# Patient Record
Sex: Male | Born: 1990 | Race: Black or African American | Hispanic: No | Marital: Single | Smoking: Former smoker
Health system: Southern US, Community
[De-identification: ages and names within clinical notes are randomized; demographics above are authoritative.]

## PROBLEM LIST (undated history)

## (undated) DIAGNOSIS — A599 Trichomoniasis, unspecified: Secondary | ICD-10-CM

---

## 2012-04-22 ENCOUNTER — Encounter (HOSPITAL_COMMUNITY): Payer: Self-pay

## 2012-04-22 ENCOUNTER — Emergency Department (HOSPITAL_COMMUNITY)
Admission: EM | Admit: 2012-04-22 | Discharge: 2012-04-22 | Disposition: A | Discharge status: Home / Self Care | Payer: No Typology Code available for payment source | Attending: Emergency Medicine | Admitting: Emergency Medicine

## 2012-04-22 ENCOUNTER — Emergency Department (HOSPITAL_COMMUNITY): Payer: No Typology Code available for payment source

## 2012-04-22 DIAGNOSIS — F17200 Nicotine dependence, unspecified, uncomplicated: Secondary | ICD-10-CM | POA: Insufficient documentation

## 2012-04-22 DIAGNOSIS — S39012A Strain of muscle, fascia and tendon of lower back, initial encounter: Secondary | ICD-10-CM

## 2012-04-22 DIAGNOSIS — M549 Dorsalgia, unspecified: Secondary | ICD-10-CM | POA: Insufficient documentation

## 2012-04-22 DIAGNOSIS — M542 Cervicalgia: Secondary | ICD-10-CM | POA: Insufficient documentation

## 2012-04-22 DIAGNOSIS — Y92410 Unspecified street and highway as the place of occurrence of the external cause: Secondary | ICD-10-CM | POA: Insufficient documentation

## 2012-04-22 MED ORDER — IBUPROFEN 800 MG PO TABS
800.0000 mg | ORAL_TABLET | Freq: Once | ORAL | Status: AC
  Administered 2012-04-22: 800 mg via ORAL
  Filled 2012-04-22: qty 1
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
## 2012-05-13 ENCOUNTER — Emergency Department (HOSPITAL_COMMUNITY)
Admission: EM | Admit: 2012-05-13 | Discharge: 2012-05-13 | Disposition: A | Discharge status: Home / Self Care | Payer: Self-pay | Attending: Emergency Medicine | Admitting: Emergency Medicine

## 2012-05-13 ENCOUNTER — Encounter (HOSPITAL_COMMUNITY): Payer: Self-pay

## 2012-05-13 DIAGNOSIS — Z202 Contact with and (suspected) exposure to infections with a predominantly sexual mode of transmission: Secondary | ICD-10-CM | POA: Insufficient documentation

## 2012-05-13 DIAGNOSIS — F17200 Nicotine dependence, unspecified, uncomplicated: Secondary | ICD-10-CM | POA: Insufficient documentation

## 2012-05-13 MED ORDER — CEFTRIAXONE SODIUM 1 G IJ SOLR
1.0000 g | Freq: Once | INTRAMUSCULAR | Status: AC
  Administered 2012-05-13: 1 g via INTRAMUSCULAR
  Filled 2012-05-13: qty 10

## 2012-05-13 MED ORDER — LIDOCAINE HCL 1 % IJ SOLN
INTRAMUSCULAR | Status: AC
  Administered 2012-05-13: 20 mL
  Filled 2012-05-13: qty 20

## 2012-05-13 MED ORDER — AZITHROMYCIN 250 MG PO TABS
1000.0000 mg | ORAL_TABLET | Freq: Once | ORAL | Status: AC
  Administered 2012-05-13: 1000 mg via ORAL
  Filled 2012-05-13: qty 4

## 2012-05-13 NOTE — ED Notes (Signed)
Per pt, girlfriend dx with chlamydia 2 days ago.  Pt has no s/sx.  Told to come in to hospital to be treated.

## 2012-05-13 NOTE — ED Provider Notes (Signed)
History   This chart was scribed for non-physician practitioner working with Lyanne Co, MD by Greer Ee. This patient was seen in room WTR1/WLPT1 and the patient's care was started at 20:47.    CSN: [id number]  Arrival date & time 05/13/12  2033   None     Chief Complaint  Patient presents with  . SEXUALLY TRANSMITTED DISEASE    (Consider location/radiation/quality/duration/timing/severity/associated sxs/prior treatment) The history is provided by the patient. No language interpreter was used.   Joshua Odonnell is a 21 y.o. male who presents to the Emergency Department after being told by his girlfriend that she has chlamydia.  Pt reports unprotected sex with girlfriend in past week.  Per pt, girlfriend had intercourse with another partner 2 months ago but is unaware of any other sexual partners.  Pt deenies penile discharge or pain, testicular pain or swelling, and any unusual rashes.  Pt has no h/o chronic medical conditions.  Pt is a current everyday smoker but denies alcohol use.  No past medical history on file.  No past surgical history on file.  No family history on file.  History  Substance Use Topics  . Smoking status: Current Every Day Smoker  . Smokeless tobacco: Not on file  . Alcohol Use: No      Review of Systems  Genitourinary: Negative for discharge, genital sores, penile pain and testicular pain.  All other systems reviewed and are negative.    Allergies  Review of patient's allergies indicates no known allergies.  Home Medications   Current Outpatient Rx  Name Route Sig Dispense Refill  . IBUPROFEN 800 MG PO TABS Oral Take 800 mg by mouth once. Pain      BP 131/79  Pulse 85  Temp 98.4 F (36.9 C) (Oral)  Resp 18  SpO2 100%  Physical Exam  Nursing note and vitals reviewed. Constitutional: He is oriented to person, place, and time. He appears well-developed and well-nourished.  HENT:  Head: Normocephalic and atraumatic.  Eyes: EOM  are normal.  Neck: Normal range of motion. No tracheal deviation present.  Cardiovascular: Normal rate, regular rhythm and normal heart sounds.   No murmur heard. Pulmonary/Chest: Effort normal. He has no wheezes.  Abdominal: Soft. There is no tenderness.  Genitourinary: Testes normal and penis normal. Right testis shows no swelling and no tenderness. Left testis shows no swelling and no tenderness. Circumcised. No penile tenderness. No discharge found.  Musculoskeletal: Normal range of motion.  Neurological: He is alert and oriented to person, place, and time.  Skin: Skin is warm.  Psychiatric: He has a normal mood and affect.    ED Course  Procedures (including critical care time) DIAGNOSTIC STUDIES: Oxygen Saturation is 100% on room air, normal by my interpretation.    COORDINATION OF CARE: 20:53- Patient informed of clinical course, understand medical decision-making process, and agree with plan.  Discussed proactively treating pt for gonorrhea and chlamydia and pt agreed.      Labs Reviewed - No data to display No results found.   1. Exposure to chlamydia   2. Exposure to STD       MDM  Patient's partner recently diagnosed with Chlamydia. He is concerned for her obtaining STD. Currently having no symptoms and feeling fine. Cultures obtained. Will treat for both gonorrhea and Chlamydia.  I personally performed the services described in this documentation, which was scribed in my presence. The recorded information has been reviewed and considered. Lyanne Co, MD   Melina Schools  Judeth Cornfield, PA-C 05/13/12 2105  Trevor Mace, PA-C 05/13/12 2106

## 2012-05-14 LAB — GC/CHLAMYDIA PROBE AMP, GENITAL: GC Probe Amp, Genital: NEGATIVE

## 2012-05-14 NOTE — ED Provider Notes (Signed)
Medical screening examination/treatment/procedure(s) were performed by non-physician practitioner and as supervising physician I was immediately available for consultation/collaboration.   Lyanne Co, MD 05/14/12 [phone]

## 2012-05-15 NOTE — ED Notes (Signed)
+   Chlamydia Patient treated with rocephin and zithromax-[facility] letter faxed. 

## 2012-05-16 NOTE — ED Notes (Signed)
Patient informed of positive results after id'd x 2 and informed of need to notify partner to be treated. 

## 2012-08-17 ENCOUNTER — Encounter (HOSPITAL_COMMUNITY): Payer: Self-pay | Admitting: Emergency Medicine

## 2012-08-17 ENCOUNTER — Emergency Department (HOSPITAL_COMMUNITY)
Admission: EM | Admit: 2012-08-17 | Discharge: 2012-08-17 | Disposition: A | Discharge status: Home / Self Care | Payer: Self-pay | Attending: Emergency Medicine | Admitting: Emergency Medicine

## 2012-08-17 DIAGNOSIS — Z79899 Other long term (current) drug therapy: Secondary | ICD-10-CM | POA: Insufficient documentation

## 2012-08-17 DIAGNOSIS — R21 Rash and other nonspecific skin eruption: Secondary | ICD-10-CM | POA: Insufficient documentation

## 2012-08-17 DIAGNOSIS — F17200 Nicotine dependence, unspecified, uncomplicated: Secondary | ICD-10-CM | POA: Insufficient documentation

## 2012-08-17 MED ORDER — FAMOTIDINE 20 MG PO TABS: 20.0000 mg | ORAL_TABLET | Freq: Two times a day (BID) | ORAL | Status: DC

## 2012-08-17 MED ORDER — DIPHENHYDRAMINE HCL 25 MG PO CAPS
25.0000 mg | ORAL_CAPSULE | Freq: Once | ORAL | Status: DC
  Filled 2012-08-17: qty 1

## 2012-08-17 MED ORDER — PREDNISONE 20 MG PO TABS: 40.0000 mg | ORAL_TABLET | Freq: Every day | ORAL | Status: DC

## 2012-08-17 MED ORDER — DIPHENHYDRAMINE HCL 25 MG PO TABS: 25.0000 mg | ORAL_TABLET | Freq: Four times a day (QID) | ORAL | Status: DC | PRN

## 2012-08-17 NOTE — ED Notes (Signed)
MD at bedside. 

## 2012-08-17 NOTE — ED Notes (Signed)
Pt reports bringing dog inside the house due to snow 2 days prior when small reddened painless rash started on chest, arms, back, and most recently legs. Pt reports it itches and has used blue star ointment on it with no relief.

## 2012-08-17 NOTE — ED Notes (Signed)
Pt alert, arrives from home, c/o rash to to arms, face, neck, forehead, onset was several days ago, believed to be associated with dog, resp even unlabored, skin pwd

## 2012-08-17 NOTE — ED Provider Notes (Signed)
History     CSN: [id number]  Arrival date & time 08/17/12  0304   First MD Initiated Contact with Patient 08/17/12 [phone]      Chief Complaint  Patient presents with  . Rash    (Consider location/radiation/quality/duration/timing/severity/associated sxs/prior treatment) HPI Comments: 22 year old male who presents approximately 24 hours of rash to his bilateral upper sternal is, trunk, lower extremities and face. He denies any lesions in his eyes or his mouth. He denies any new exposures including foods, topical, inhalational or medications. The symptoms are mild, persistent causing itching but no difficulty breathing.  Patient is a 22 y.o. male presenting with rash. The history is provided by the patient.  Rash     History reviewed. No pertinent past medical history.  History reviewed. No pertinent past surgical history.  No family history on file.  History  Substance Use Topics  . Smoking status: Current Every Day Smoker  . Smokeless tobacco: Not on file  . Alcohol Use: No      Review of Systems  Constitutional: Negative for fever, chills and fatigue.  Respiratory: Negative for shortness of breath.   Gastrointestinal: Negative for nausea and vomiting.  Musculoskeletal: Negative for joint swelling.  Skin: Positive for rash.  Neurological: Negative for weakness and headaches.  Hematological: Negative for adenopathy.    Allergies  Review of patient's allergies indicates no known allergies.  Home Medications   Current Outpatient Rx  Name  Route  Sig  Dispense  Refill  . DIPHENHYDRAMINE HCL 25 MG PO TABS   Oral   Take 1 tablet (25 mg total) by mouth every 6 (six) hours as needed for itching (Rash).   30 tablet   0   . FAMOTIDINE 20 MG PO TABS   Oral   Take 1 tablet (20 mg total) by mouth 2 (two) times daily.   30 tablet   0   . IBUPROFEN 800 MG PO TABS   Oral   Take 800 mg by mouth once. Pain         . PREDNISONE 20 MG PO TABS   Oral   Take 2 tablets  (40 mg total) by mouth daily.   10 tablet   0     BP 133/77  Pulse 79  Temp 97.4 F (36.3 C) (Oral)  Resp 16  Wt 165 lb (74.844 kg)  SpO2 100%  Physical Exam  Nursing note and vitals reviewed. Constitutional: He appears well-developed and well-nourished. No distress.  HENT:  Head: Normocephalic and atraumatic.  Eyes: Conjunctivae normal are normal. No scleral icterus.  Cardiovascular: Normal rate, regular rhythm and intact distal pulses.   Pulmonary/Chest: Effort normal and breath sounds normal.  Musculoskeletal: He exhibits no edema and no tenderness.  Neurological: He is alert.  Skin: Skin is warm and dry. Rash noted. He is not diaphoretic.       Erythematous rash, no definite urticarial rash, no petechiae or purpura.  Scattered over the body / trunk / extremities    ED Course  Procedures (including critical care time)  Labs Reviewed - No data to display No results found.   1. Rash       MDM  This is well-appearing, benign appearing rash, stable for discharge on prednisone Benadryl.        Vida Roller, MD 08/17/12 [phone]

## 2012-08-17 NOTE — ED Notes (Signed)
Pt was upset because he didn't want to drive to get his prescriptions filled. Informed pt that I did not feel comfortable driving home with Benadryl in his system due to side effects of making him drowsy. Pt requested that I scan it, give him the medication, and he ingest the medication when he gets home. Informed pt that I could not legally do that. Pt AAOx4, ambulatory, and in no acute distress. Informed pt about pharmacies open 24 hours. Informed pt that the Benadryl I was about to give him is available otc in any open stores, specifically [employer] (and directed him to the right address).

## 2012-10-06 ENCOUNTER — Encounter (HOSPITAL_COMMUNITY): Payer: Self-pay | Admitting: Emergency Medicine

## 2012-10-06 ENCOUNTER — Emergency Department (HOSPITAL_COMMUNITY)
Admission: EM | Admit: 2012-10-06 | Discharge: 2012-10-06 | Disposition: A | Discharge status: Home / Self Care | Payer: No Typology Code available for payment source | Attending: Emergency Medicine | Admitting: Emergency Medicine

## 2012-10-06 DIAGNOSIS — S0993XA Unspecified injury of face, initial encounter: Secondary | ICD-10-CM | POA: Insufficient documentation

## 2012-10-06 DIAGNOSIS — S0990XA Unspecified injury of head, initial encounter: Secondary | ICD-10-CM | POA: Insufficient documentation

## 2012-10-06 DIAGNOSIS — S199XXA Unspecified injury of neck, initial encounter: Secondary | ICD-10-CM | POA: Insufficient documentation

## 2012-10-06 DIAGNOSIS — M7918 Myalgia, other site: Secondary | ICD-10-CM

## 2012-10-06 DIAGNOSIS — IMO0002 Reserved for concepts with insufficient information to code with codable children: Secondary | ICD-10-CM | POA: Insufficient documentation

## 2012-10-06 DIAGNOSIS — Y92410 Unspecified street and highway as the place of occurrence of the external cause: Secondary | ICD-10-CM | POA: Insufficient documentation

## 2012-10-06 DIAGNOSIS — F17200 Nicotine dependence, unspecified, uncomplicated: Secondary | ICD-10-CM | POA: Insufficient documentation

## 2012-10-06 DIAGNOSIS — Y939 Activity, unspecified: Secondary | ICD-10-CM | POA: Insufficient documentation

## 2012-10-06 MED ORDER — DIAZEPAM 5 MG PO TABS: 5.0000 mg | ORAL_TABLET | Freq: Four times a day (QID) | ORAL | Status: DC | PRN

## 2012-10-06 MED ORDER — OXYCODONE-ACETAMINOPHEN 5-325 MG PO TABS: ORAL_TABLET | ORAL | Status: DC

## 2012-10-06 MED ORDER — IBUPROFEN 800 MG PO TABS: 800.0000 mg | ORAL_TABLET | Freq: Once | ORAL | Status: DC
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
## 2012-10-07 NOTE — ED Provider Notes (Signed)
Medical screening examination/treatment/procedure(s) were performed by non-physician practitioner and as supervising physician I was immediately available for consultation/collaboration.   Eben Choinski R Shareef Eddinger, MD 10/07/12 1627 

## 2012-11-30 ENCOUNTER — Emergency Department (HOSPITAL_COMMUNITY)
Admission: EM | Admit: 2012-11-30 | Discharge: 2012-11-30 | Disposition: A | Discharge status: Home / Self Care | Payer: Self-pay | Attending: Emergency Medicine | Admitting: Emergency Medicine

## 2012-11-30 ENCOUNTER — Encounter (HOSPITAL_COMMUNITY): Payer: Self-pay

## 2012-11-30 DIAGNOSIS — F17200 Nicotine dependence, unspecified, uncomplicated: Secondary | ICD-10-CM | POA: Insufficient documentation

## 2012-11-30 DIAGNOSIS — M545 Low back pain, unspecified: Secondary | ICD-10-CM | POA: Insufficient documentation

## 2012-11-30 MED ORDER — IBUPROFEN 800 MG PO TABS: 800.0000 mg | ORAL_TABLET | Freq: Three times a day (TID) | ORAL | Status: DC

## 2012-11-30 MED ORDER — CYCLOBENZAPRINE HCL 10 MG PO TABS: 10.0000 mg | ORAL_TABLET | Freq: Two times a day (BID) | ORAL | Status: DC | PRN

## 2012-11-30 NOTE — ED Notes (Signed)
Pt presents with NAD.  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  Pt completed shift last night and attempted to work this evening. Left work this evening with c/o of back injury.  Contact supervisor John.  Aware pt is here. Contact Number [phone]. Pt states he is not required to take drug test.  [employer] is the name of pt's employer. Pt present neuro intact PERRL no loss of bowel or bladder and denies numbness or tingling

## 2012-11-30 NOTE — ED Provider Notes (Signed)
History    This chart was scribed for non-physician practitioner Elpidio Anis PA-C working with Doug Sou, MD by Smitty Pluck, ED scribe. This patient was seen in room WTR5/WTR5 and the patient's care was started at 10:15 PM.   CSN: [id number]  Arrival date & time 11/30/12  2000     No chief complaint on file.    The history is provided by the patient and medical records. No language interpreter was used.   HPI Comments: Joshua Odonnell is a 22 y.o. male who presents to the Emergency Department complaining of lower back pain onset 1 day ago worsening today. Pt reports that he was lifting heavy objects at work yesterday and noticed the pain after lifting. Pt reports that he has taken tylenol and advil without relief. Pain is aggravated by movement and bending. Pt denies urinary incontinence and bowel incontinence,  fever, chills, nausea, vomiting, diarrhea, weakness, cough, SOB and any other pain.     No past medical history on file.  No past surgical history on file.  No family history on file.  History  Substance Use Topics  . Smoking status: Current Every Day Smoker  . Smokeless tobacco: Not on file  . Alcohol Use: No      Review of Systems  Constitutional: Negative for fever and chills.  Respiratory: Negative for shortness of breath.   Gastrointestinal: Negative for nausea and vomiting.  Musculoskeletal: Positive for back pain.  Neurological: Negative for weakness.    Allergies  Review of patient's allergies indicates no known allergies.  Home Medications  No current outpatient prescriptions on file.  BP 123/60  Pulse 82  Temp(Src) 98.3 F (36.8 C) (Oral)  Resp 18  Ht 6' (1.829 m)  Wt 155 lb (70.308 kg)  BMI 21.02 kg/m2  SpO2 100%  Physical Exam  Nursing note and vitals reviewed. Constitutional: He is oriented to person, place, and time. He appears well-developed and well-nourished. No distress.  HENT:  Head: Normocephalic and atraumatic.  Eyes:  EOM are normal.  Neck: Neck supple. No tracheal deviation present.  Cardiovascular: Normal rate, regular rhythm and normal heart sounds.   Pulmonary/Chest: Effort normal. No respiratory distress.  Musculoskeletal: Normal range of motion.  Right paralumbar tenderness without spasm No swelling visualized   Neurological: He is alert and oriented to person, place, and time. He has normal reflexes.  Skin: Skin is warm and dry.  Psychiatric: He has a normal mood and affect. His behavior is normal.    ED Course  Procedures (including critical care time) DIAGNOSTIC STUDIES: Oxygen Saturation is 100% on room air, normal by my interpretation.    COORDINATION OF CARE: 10:17 PM Discussed ED treatment with pt and pt agrees.     Labs Reviewed - No data to display No results found.   No diagnosis found.  1. Low back pain  MDM  Uncomplicated muscular low back pain.      I personally performed the services described in this documentation, which was scribed in my presence. The recorded information has been reviewed and is accurate.     Arnoldo Hooker, PA-C 12/01/12 0100

## 2012-12-02 NOTE — ED Provider Notes (Signed)
Medical screening examination/treatment/procedure(s) were performed by non-physician practitioner and as supervising physician I was immediately available for consultation/collaboration.  Berneice Zettlemoyer, MD 12/02/12 1501 

## 2013-08-31 ENCOUNTER — Encounter (HOSPITAL_COMMUNITY): Payer: Self-pay | Admitting: Emergency Medicine

## 2013-08-31 ENCOUNTER — Emergency Department (HOSPITAL_COMMUNITY)
Admission: EM | Admit: 2013-08-31 | Discharge: 2013-08-31 | Discharge status: Against Medical Advice | Payer: No Typology Code available for payment source | Attending: Emergency Medicine | Admitting: Emergency Medicine

## 2013-08-31 DIAGNOSIS — R109 Unspecified abdominal pain: Secondary | ICD-10-CM | POA: Insufficient documentation

## 2013-08-31 DIAGNOSIS — F17200 Nicotine dependence, unspecified, uncomplicated: Secondary | ICD-10-CM | POA: Insufficient documentation
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
## 2013-08-31 NOTE — ED Notes (Signed)
Per pt, states having abdominal pain for 2 days-happens with BMs-states some diarrhea-no N/V

## 2014-05-18 ENCOUNTER — Emergency Department (HOSPITAL_COMMUNITY)
Admission: EM | Admit: 2014-05-18 | Discharge: 2014-05-18 | Disposition: A | Discharge status: Home / Self Care | Payer: No Typology Code available for payment source | Attending: Emergency Medicine | Admitting: Emergency Medicine

## 2014-05-18 ENCOUNTER — Encounter (HOSPITAL_COMMUNITY): Payer: Self-pay | Admitting: Emergency Medicine

## 2014-05-18 DIAGNOSIS — A599 Trichomoniasis, unspecified: Secondary | ICD-10-CM

## 2014-05-18 DIAGNOSIS — Z720 Tobacco use: Secondary | ICD-10-CM | POA: Insufficient documentation

## 2014-05-18 DIAGNOSIS — Z791 Long term (current) use of non-steroidal anti-inflammatories (NSAID): Secondary | ICD-10-CM | POA: Insufficient documentation

## 2014-05-18 DIAGNOSIS — Z760 Encounter for issue of repeat prescription: Secondary | ICD-10-CM

## 2014-05-18 HISTORY — DX: Trichomoniasis, unspecified: A59.9

## 2014-05-18 MED ORDER — METRONIDAZOLE 500 MG PO TABS: 500.0000 mg | ORAL_TABLET | Freq: Two times a day (BID) | ORAL | Status: DC

## 2014-05-18 NOTE — ED Notes (Signed)
Patient was diagnosed with Trichomonas on Friday. Given antibiotics but is unsure of antibiotic. Has been taking it but forgot it in [place]. Has been here for 2 days. No other complaints/concerns.

## 2014-05-18 NOTE — ED Provider Notes (Signed)
CSN: [id number]     Arrival date & time 05/18/14  1839 History  This chart was scribed for non-physician practitioner Celene Skeenobyn Lanisha Stepanian, PA-C working with Dr. Freida BusmanAllen by Conchita ParisNadim Abuhashem, ED Scribe. This patient was seen in WTR5/WTR5 and the patient's care was started at 6:57 PM.     No chief complaint on file.  The history is provided by the patient. No language interpreter was used.    HPI Comments: Joshua Odonnell is a 23 y.o. male who presents to the Emergency Department for a prescription refill. Pt states he was diagnosed with Trichomonas 4 days ago and was given Flagyl. Pt notes he has been taking it twice a day but [place] in [place]. Pt has been in [place] for two days and reports he only took two days worth of the medication. He has no other medical complaints. Pt has no PCP.   Past Medical History  Diagnosis Date  . Trichomonas infection    History reviewed. No pertinent past surgical history. History reviewed. No pertinent family history. History  Substance Use Topics  . Smoking status: Current Every Day Smoker  . Smokeless tobacco: Not on file  . Alcohol Use: No    Review of Systems  10 Systems reviewed and are negative for acute change except as noted in the HPI.  Allergies  Review of patient's allergies indicates no known allergies.  Home Medications   Prior to Admission medications   Medication Sig Start Date End Date Taking? Authorizing Provider  cyclobenzaprine (FLEXERIL) 10 MG tablet Take 1 tablet (10 mg total) by mouth 2 (two) times daily as needed for muscle spasms. 11/30/12   Shari A Upstill, PA-C  ibuprofen (ADVIL,MOTRIN) 800 MG tablet Take 1 tablet (800 mg total) by mouth 3 (three) times daily. 11/30/12   Shari A Upstill, PA-C  metroNIDAZOLE (FLAGYL) 500 MG tablet Take 1 tablet (500 mg total) by mouth 2 (two) times daily. One po bid x 5 days 05/18/14   Alfrieda Tarry M Floyed Masoud, PA-C   BP 152/86 mmHg  Pulse 93  Temp(Src) 99.4 F (37.4 C) (Oral)  Resp 18   SpO2 100% Physical Exam  Constitutional: He is oriented to person, place, and time. He appears well-developed and well-nourished. No distress.  HENT:  Head: Normocephalic and atraumatic.  Eyes: Conjunctivae and EOM are normal.  Neck: Normal range of motion. Neck supple.  Cardiovascular: Normal rate, regular rhythm and normal heart sounds.   Pulmonary/Chest: Effort normal and breath sounds normal.  Musculoskeletal: Normal range of motion. He exhibits no edema.  Neurological: He is alert and oriented to person, place, and time.  Skin: Skin is warm and dry.  Psychiatric: He has a normal mood and affect. His behavior is normal.  Nursing note and vitals reviewed.   ED Course  Procedures  DIAGNOSTIC STUDIES: Oxygen Saturation is 100% on room air, normal by my interpretation.    COORDINATION OF CARE: 7:00 PM Discussed treatment plan with pt at bedside and pt agreed to plan.   Labs Review Labs Reviewed - No data to display  Imaging Review No results found.   EKG Interpretation None      MDM   Final diagnoses:  Medication refill  Trichomonas infection   Five day rx for flagyl given. Pt in NAD. VSS. Stable for d/c. Return precautions given. Patient states understanding of treatment care plan and is agreeable.  I personally performed the services described in this documentation, which was scribed in my presence. The recorded information  has been reviewed and is accurate.   Kathrynn SpeedRobyn M Rafay Dahan, PA-C 05/19/14 1044

## 2014-05-18 NOTE — Discharge Instructions (Signed)
Take antibiotic to completion.  Trichomoniasis Trichomoniasis is an infection caused by an organism called Trichomonas. The infection can affect both women and men. In women, the outer male genitalia and the vagina are affected. In men, the penis is mainly affected, but the prostate and other reproductive organs can also be involved. Trichomoniasis is a sexually transmitted infection (STI) and is most often passed to another person through sexual contact.  RISK FACTORS  Having unprotected sexual intercourse.  Having sexual intercourse with an infected partner. SIGNS AND SYMPTOMS  Symptoms of trichomoniasis in women include:  Abnormal gray-green frothy vaginal discharge.  Itching and irritation of the vagina.  Itching and irritation of the area outside the vagina. Symptoms of trichomoniasis in men include:   Penile discharge with or without pain.  Pain during urination. This results from inflammation of the urethra. DIAGNOSIS  Trichomoniasis may be found during a Pap test or physical exam. Your health care provider may use one of the following methods to help diagnose this infection:  Examining vaginal discharge under a microscope. For men, urethral discharge would be examined.  Testing the pH of the vagina with a test tape.  Using a vaginal swab test that checks for the Trichomonas organism. A test is available that provides results within a few minutes.  Doing a culture test for the organism. This is not usually needed. TREATMENT   You may be given medicine to fight the infection. Women should inform their health care provider if they could be or are pregnant. Some medicines used to treat the infection should not be taken during pregnancy.  Your health care provider may recommend over-the-counter medicines or creams to decrease itching or irritation.  Your sexual partner will need to be treated if infected. HOME CARE INSTRUCTIONS   Take medicines only as directed by your  health care provider.  Take over-the-counter medicine for itching or irritation as directed by your health care provider.  Do not have sexual intercourse while you have the infection.  Women should not douche or wear tampons while they have the infection.  Discuss your infection with your partner. Your partner may have gotten the infection from you, or you may have gotten it from your partner.  Have your sex partner get examined and treated if necessary.  Practice safe, informed, and protected sex.  See your health care provider for other STI testing. SEEK MEDICAL CARE IF:   You still have symptoms after you finish your medicine.  You develop abdominal pain.  You have pain when you urinate.  You have bleeding after sexual intercourse.  You develop a rash.  Your medicine makes you sick or makes you throw up (vomit). MAKE SURE YOU:  Understand these instructions.  Will watch your condition.  Will get help right away if you are not doing well or get worse. Document Released: 12/26/2000 Document Revised: 11/16/2013 Document Reviewed: 04/13/2013 [facility] Patient Information 2015 [place], [place]. This information is not intended to replace advice given to you by your health care provider. Make sure you discuss any questions you have with your health care provider.  Sexually Transmitted Disease A sexually transmitted disease (STD) is a disease or infection that may be passed (transmitted) from person to person, usually during sexual activity. This may happen by way of saliva, semen, blood, vaginal mucus, or urine. Common STDs include:   Gonorrhea.   Chlamydia.   Syphilis.   HIV and AIDS.   Genital herpes.   Hepatitis B and C.  Trichomonas.   Human papillomavirus (HPV).   Pubic lice.   Scabies.  Mites.  Bacterial vaginosis. WHAT ARE CAUSES OF STDs? An STD may be caused by bacteria, a virus, or parasites. STDs are often transmitted during sexual  activity if one person is infected. However, they may also be transmitted through nonsexual means. STDs may be transmitted after:   Sexual intercourse with an infected person.   Sharing sex toys with an infected person.   Sharing needles with an infected person or using unclean piercing or tattoo needles.  Having intimate contact with the genitals, mouth, or rectal areas of an infected person.   Exposure to infected fluids during birth. WHAT ARE THE SIGNS AND SYMPTOMS OF STDs? Different STDs have different symptoms. Some people may not have any symptoms. If symptoms are present, they may include:   Painful or bloody urination.   Pain in the pelvis, abdomen, vagina, anus, throat, or eyes.   A skin rash, itching, or irritation.  Growths, ulcerations, blisters, or sores in the genital and anal areas.  Abnormal vaginal discharge with or without bad odor.   Penile discharge in men.   Fever.   Pain or bleeding during sexual intercourse.   Swollen glands in the groin area.   Yellow skin and eyes (jaundice). This is seen with hepatitis.   Swollen testicles.  Infertility.  Sores and blisters in the mouth. HOW ARE STDs DIAGNOSED? To make a diagnosis, your health care provider may:   Take a medical history.   Perform a physical exam.   Take a sample of any discharge to examine.  Swab the throat, cervix, opening to the penis, rectum, or vagina for testing.  Test a sample of your first morning urine.   Perform blood tests.   Perform a Pap test, if this applies.   Perform a colposcopy.   Perform a laparoscopy.  HOW ARE STDs TREATED? Treatment depends on the STD. Some STDs may be treated but not cured.   Chlamydia, gonorrhea, trichomonas, and syphilis can be cured with antibiotic medicine.   Genital herpes, hepatitis, and HIV can be treated, but not cured, with prescribed medicines. The medicines lessen symptoms.   Genital warts from HPV can be  treated with medicine or by freezing, burning (electrocautery), or surgery. Warts may come back.   HPV cannot be cured with medicine or surgery. However, abnormal areas may be removed from the cervix, vagina, or vulva.   If your diagnosis is confirmed, your recent sexual partners need treatment. This is true even if they are symptom-free or have a negative culture or evaluation. They should not have sex until their health care providers say it is okay. HOW CAN I REDUCE MY RISK OF GETTING AN STD? Take these steps to reduce your risk of getting an STD:  Use latex condoms, dental dams, and water-soluble lubricants during sexual activity. Do not use petroleum jelly or oils.  Avoid having multiple sex partners.  Do not have sex with someone who has other sex partners.  Do not have sex with anyone you do not know or who is at high risk for an STD.  Avoid risky sex practices that can break your skin.  Do not have sex if you have open sores on your mouth or skin.  Avoid drinking too much alcohol or taking illegal drugs. Alcohol and drugs can affect your judgment and put you in a vulnerable position.  Avoid engaging in oral and anal sex acts.  Get vaccinated for  HPV and hepatitis. If you have not received these vaccines in the past, talk to your health care provider about whether one or both might be right for you.   If you are at risk of being infected with HIV, it is recommended that you take a prescription medicine daily to prevent HIV infection. This is called pre-exposure prophylaxis (PrEP). You are considered at risk if:  You are a man who has sex with other men (MSM).  You are a heterosexual man or woman and are sexually active with more than one partner.  You take drugs by injection.  You are sexually active with a partner who has HIV.  Talk with your health care provider about whether you are at high risk of being infected with HIV. If you choose to begin PrEP, you should first  be tested for HIV. You should then be tested every 3 months for as long as you are taking PrEP.  WHAT SHOULD I DO IF I THINK I HAVE AN STD?  See your health care provider.   Tell your sexual partner(s). They should be tested and treated for any STDs.  Do not have sex until your health care provider says it is okay. WHEN SHOULD I GET IMMEDIATE MEDICAL CARE? Contact your health care provider right away if:   You have severe abdominal pain.  You are a man and notice swelling or pain in your testicles.  You are a woman and notice swelling or pain in your vagina. Document Released: 09/22/2002 Document Revised: 07/07/2013 Document Reviewed: 01/20/2013 [facility] Patient Information 2015 [place], [place]. This information is not intended to replace advice given to you by your health care provider. Make sure you discuss any questions you have with your health care provider.

## 2018-07-29 ENCOUNTER — Other Ambulatory Visit: Payer: Self-pay

## 2018-07-29 ENCOUNTER — Encounter (HOSPITAL_COMMUNITY): Payer: Self-pay | Admitting: *Deleted

## 2018-07-29 DIAGNOSIS — J029 Acute pharyngitis, unspecified: Secondary | ICD-10-CM | POA: Insufficient documentation

## 2018-07-29 DIAGNOSIS — Z87891 Personal history of nicotine dependence: Secondary | ICD-10-CM | POA: Insufficient documentation

## 2018-07-29 LAB — GROUP A STREP BY PCR: Group A Strep by PCR: NOT DETECTED

## 2018-07-29 NOTE — ED Notes (Signed)
Pt c/o sore throat x 1 week, sts it feels like there is something in his throat and he has to constantly clear his throat, sts pain is worse when laying down, feels as if he is choking. No other symptoms reported

## 2018-07-30 ENCOUNTER — Emergency Department (HOSPITAL_COMMUNITY)
Admission: EM | Admit: 2018-07-30 | Discharge: 2018-07-30 | Disposition: A | Discharge status: Home / Self Care | Payer: Self-pay | Attending: Emergency Medicine | Admitting: Emergency Medicine

## 2018-07-30 DIAGNOSIS — J029 Acute pharyngitis, unspecified: Secondary | ICD-10-CM

## 2018-07-30 MED ORDER — MAGIC MOUTHWASH W/LIDOCAINE: 10.0000 mL | Freq: Three times a day (TID) | ORAL | 0 refills | Status: DC | PRN

## 2018-07-30 MED ORDER — LIDOCAINE VISCOUS HCL 2 % MT SOLN
15.0000 mL | Freq: Once | OROMUCOSAL | Status: AC
  Administered 2018-07-30: 15 mL via ORAL
  Filled 2018-07-30: qty 15

## 2018-07-30 MED ORDER — IBUPROFEN 800 MG PO TABS
800.0000 mg | ORAL_TABLET | Freq: Once | ORAL | Status: AC
Start: 2018-07-30 — End: 2018-07-30
  Administered 2018-07-30: 800 mg via ORAL
  Filled 2018-07-30: qty 1

## 2018-07-30 MED ORDER — ALUM & MAG HYDROXIDE-SIMETH 200-200-20 MG/5ML PO SUSP
30.0000 mL | Freq: Once | ORAL | Status: AC
  Administered 2018-07-30: 30 mL via ORAL
  Filled 2018-07-30: qty 30

## 2018-07-30 NOTE — ED Provider Notes (Signed)
[facility]-EMERGENCY DEPT Provider Note   CSN: [id number] Arrival date & time: 07/29/18  2131     History   Chief Complaint Chief Complaint  Patient presents with  . Sore Throat    HPI Joshua Odonnell is a 28 y.o. male.  28 year old male presents to the emergency department for evaluation of dysphasia.  He reports sore throat x1 week.  He feels as though there is something stuck in his throat at times.  Also reports anxiety when laying down as he feels as though he is going to choke and be unable to breathe.  He is continued to eat and drink without difficulty.  Noticed onset of symptoms when he stopped smoking.  He has not had any sick contacts.  No fevers, inability to swallow, drooling, vomiting.  No medications taken prior to arrival.  The history is provided by the patient. No language interpreter was used.  Sore Throat     Past Medical History:  Diagnosis Date  . Trichomonas infection     There are no active problems to display for this patient.   History reviewed. No pertinent surgical history.      Home Medications    Prior to Admission medications   Medication Sig Start Date End Date Taking? Authorizing Provider  DM-Doxylamine-Acetaminophen (NYQUIL HBP COLD & FLU) 15-6.25-325 MG/15ML LIQD Take 30 mLs by mouth 2 (two) times daily as needed (cough).   Yes [provider]  Phenylephrine-DM-GG-APAP (MUCINEX FAST-MAX COLD FLU) 5-10-200-325 MG/10ML LIQD Take 20 mLs by mouth every 6 (six) hours as needed (cough).   Yes [provider]  cyclobenzaprine (FLEXERIL) 10 MG tablet Take 1 tablet (10 mg total) by mouth 2 (two) times daily as needed for muscle spasms. Patient not taking: Reported on 07/30/2018 11/30/12   Elpidio Anis, PA-C  ibuprofen (ADVIL,MOTRIN) 800 MG tablet Take 1 tablet (800 mg total) by mouth 3 (three) times daily. Patient not taking: Reported on 07/30/2018 11/30/12   Elpidio Anis, PA-C  magic mouthwash w/lidocaine  SOLN Take 10 mLs by mouth 3 (three) times daily as needed (for sore throat). 07/30/18   Antony Madura, PA-C  metroNIDAZOLE (FLAGYL) 500 MG tablet Take 1 tablet (500 mg total) by mouth 2 (two) times daily. One po bid x 5 days Patient not taking: Reported on 07/30/2018 05/18/14   Kathrynn Speed, PA-C    Family History No family history on file.  Social History Social History   Tobacco Use  . Smoking status: Former Smoker    Last attempt to quit: 07/16/2018    Years since quitting: 0.0  . Smokeless tobacco: Never Used  Substance Use Topics  . Alcohol use: No  . Drug use: No     Allergies   Patient has no known allergies.   Review of Systems Review of Systems Ten systems reviewed and are negative for acute change, except as noted in the HPI.    Physical Exam Updated Vital Signs BP (!) 148/95 (BP Location: Right Arm)   Pulse (!) 58   Temp 97.6 F (36.4 C) (Oral)   Resp 18   Ht 5\' 11"  (1.803 m)   Wt 97.5 kg   SpO2 100%   BMI 29.99 kg/m   Physical Exam Vitals signs and nursing note reviewed.  Constitutional:      General: He is not in acute distress.    Appearance: He is well-developed. He is not diaphoretic.     Comments: Nontoxic-appearing  HENT:  Head: Normocephalic and atraumatic.     Mouth/Throat:     Comments: No posterior oropharyngeal erythema or exudates.  Uvula midline.  Patient tolerating secretions without difficulty.  No tripoding or voice muffling. Eyes:     General: No scleral icterus.    Conjunctiva/sclera: Conjunctivae normal.  Neck:     Musculoskeletal: Normal range of motion.     Comments: No meningismus Pulmonary:     Effort: Pulmonary effort is normal. No respiratory distress.     Comments: Respirations even and unlabored.  No stridor. Musculoskeletal: Normal range of motion.  Skin:    General: Skin is warm and dry.     Coloration: Skin is not pale.     Findings: No erythema or rash.  Neurological:     Mental Status: He is alert and  oriented to person, place, and time.     Coordination: Coordination normal.  Psychiatric:        Behavior: Behavior normal.      ED Treatments / Results  Labs (all labs ordered are listed, but only abnormal results are displayed) Labs Reviewed  GROUP A STREP BY PCR    EKG None  Radiology No results found.  Procedures Procedures (including critical care time)  Medications Ordered in ED Medications  alum & mag hydroxide-simeth (MAALOX/MYLANTA) 200-200-20 MG/5ML suspension 30 mL (30 mLs Oral Given 07/30/18 0253)    And  lidocaine (XYLOCAINE) 2 % viscous mouth solution 15 mL (15 mLs Oral Given 07/30/18 0253)  ibuprofen (ADVIL,MOTRIN) tablet 800 mg (800 mg Oral Given 07/30/18 0253)     Initial Impression / Assessment and Plan / ED Course  I have reviewed the triage vital signs and the nursing notes.  Pertinent labs & imaging results that were available during my care of the patient were reviewed by me and considered in my medical decision making (see chart for details).     Patient afebrile without tonsillar exudate, negative strep. Presents with dysphagia x 1 week. Suspect viral etiology. Presentation not concerning for PTA or infxn spread to soft tissue. No trismus or uvula deviation.  He has continued to eat and drink without difficulty.  I have encouraged primary care follow-up.  Will discharge with prescription for Magic mouthwash.  Ibuprofen advised.  Return precautions discussed and provided. Patient discharged in stable condition with no unaddressed concerns.   Final Clinical Impressions(s) / ED Diagnoses   Final diagnoses:  Sore throat    ED Discharge Orders         Ordered    magic mouthwash w/lidocaine SOLN  3 times daily PRN    Note to Pharmacy:  Doree Barthel, Maalox, Lidocaine at 1:1:1   07/30/18 0249           Antony Madura, PA-C 07/30/18 0259    Devoria Albe, MD 07/30/18 [phone]

## 2019-12-02 ENCOUNTER — Encounter (HOSPITAL_COMMUNITY): Payer: Self-pay

## 2019-12-02 ENCOUNTER — Other Ambulatory Visit: Payer: Self-pay

## 2019-12-02 DIAGNOSIS — H6991 Unspecified Eustachian tube disorder, right ear: Secondary | ICD-10-CM | POA: Insufficient documentation

## 2019-12-02 NOTE — ED Triage Notes (Signed)
Right ear pain for 2 days.

## 2019-12-03 ENCOUNTER — Emergency Department (HOSPITAL_COMMUNITY)
Admission: EM | Admit: 2019-12-03 | Discharge: 2019-12-03 | Disposition: A | Discharge status: Home / Self Care | Payer: Self-pay | Attending: Emergency Medicine | Admitting: Emergency Medicine

## 2019-12-03 DIAGNOSIS — H6981 Other specified disorders of Eustachian tube, right ear: Secondary | ICD-10-CM

## 2019-12-03 DIAGNOSIS — H9201 Otalgia, right ear: Secondary | ICD-10-CM

## 2019-12-03 MED ORDER — NAPROXEN 500 MG PO TABS
500.0000 mg | ORAL_TABLET | Freq: Once | ORAL | Status: AC
  Administered 2019-12-03: 500 mg via ORAL
  Filled 2019-12-03: qty 1

## 2019-12-03 MED ORDER — CETIRIZINE HCL 5 MG/5ML PO SOLN: 5.0000 mg | Freq: Once | ORAL | Status: DC

## 2019-12-03 MED ORDER — LORATADINE 10 MG PO TABS: 10.0000 mg | ORAL_TABLET | Freq: Every day | ORAL | Status: DC

## 2019-12-03 MED ORDER — PSEUDOEPHEDRINE HCL ER 120 MG PO TB12
120.0000 mg | ORAL_TABLET | Freq: Two times a day (BID) | ORAL | Status: DC
  Administered 2019-12-03: 120 mg via ORAL
  Filled 2019-12-03: qty 1

## 2019-12-03 MED ORDER — NAPROXEN 500 MG PO TABS: 500.0000 mg | ORAL_TABLET | Freq: Two times a day (BID) | ORAL | 0 refills | Status: DC

## 2019-12-03 MED ORDER — CETIRIZINE-PSEUDOEPHEDRINE ER 5-120 MG PO TB12: 1.0000 | ORAL_TABLET | Freq: Every day | ORAL | 0 refills | Status: DC

## 2019-12-03 NOTE — ED Provider Notes (Signed)
[facility] DEPT Provider Note   CSN: [id number] Arrival date & time: 12/02/19  2315     History Chief Complaint  Patient presents with  . Otalgia    Joshua Odonnell is a 29 y.o. male.  Patient reports right ear pain for 2 days.  Denies any fever or URI symptoms.  Denies any trouble chewing or tooth or dental pain.  Has tried peroxide in his ear without relief.        Past Medical History:  Diagnosis Date  . Trichomonas infection     There are no problems to display for this patient.   History reviewed. No pertinent surgical history.     No family history on file.  Social History   Tobacco Use  . Smoking status: Former Smoker    Quit date: 07/16/2018    Years since quitting: 1.3  . Smokeless tobacco: Never Used  Substance Use Topics  . Alcohol use: No  . Drug use: No    Home Medications Prior to Admission medications   Medication Sig Start Date End Date Taking? Authorizing Provider  cetirizine-pseudoephedrine (ZYRTEC-D) 5-120 MG tablet Take 1 tablet by mouth daily. 12/03/19   Alveria Apley, PA-C  cyclobenzaprine (FLEXERIL) 10 MG tablet Take 1 tablet (10 mg total) by mouth 2 (two) times daily as needed for muscle spasms. Patient not taking: Reported on 07/30/2018 11/30/12   Charlann Lange, PA-C  DM-Doxylamine-Acetaminophen (NYQUIL HBP COLD & FLU) 15-6.25-325 MG/15ML LIQD Take 30 mLs by mouth 2 (two) times daily as needed (cough).    [provider]  ibuprofen (ADVIL,MOTRIN) 800 MG tablet Take 1 tablet (800 mg total) by mouth 3 (three) times daily. Patient not taking: Reported on 07/30/2018 11/30/12   Charlann Lange, PA-C  magic mouthwash w/lidocaine SOLN Take 10 mLs by mouth 3 (three) times daily as needed (for sore throat). 07/30/18   Antonietta Breach, PA-C  metroNIDAZOLE (FLAGYL) 500 MG tablet Take 1 tablet (500 mg total) by mouth 2 (two) times daily. One po bid x 5 days Patient not taking: Reported on 07/30/2018 05/18/14   Hess,  Hessie Diener, PA-C  naproxen (NAPROSYN) 500 MG tablet Take 1 tablet (500 mg total) by mouth 2 (two) times daily. 12/03/19   Madilyn Hook A, PA-C  Phenylephrine-DM-GG-APAP (MUCINEX FAST-MAX COLD FLU) 5-10-200-325 MG/10ML LIQD Take 20 mLs by mouth every 6 (six) hours as needed (cough).    [provider]    Allergies    Patient has no known allergies.  Review of Systems   Review of Systems  Constitutional: Negative for chills and fever.  HENT: Positive for ear pain. Negative for congestion, dental problem, drooling, ear discharge, facial swelling, rhinorrhea, sinus pressure, sneezing, sore throat and trouble swallowing.   Respiratory: Negative for cough and shortness of breath.   All other systems reviewed and are negative.   Physical Exam Updated Vital Signs BP (!) 178/102 (BP Location: Left Arm)   Pulse (!) 50   Temp 98.4 F (36.9 C) (Oral)   Resp 18   Ht 6' (1.829 m)   Wt 97.5 kg   SpO2 97%   BMI 29.16 kg/m   Physical Exam Vitals and nursing note reviewed.  Constitutional:      General: He is not in acute distress.    Appearance: Normal appearance. He is not ill-appearing, toxic-appearing or diaphoretic.  HENT:     Head: Normocephalic.     Right Ear: No drainage or swelling. No middle ear effusion. There is  no impacted cerumen. No mastoid tenderness. Tympanic membrane is bulging. Tympanic membrane is not injected, scarred, perforated, erythematous or retracted.     Left Ear: Tympanic membrane normal.     Nose: Nose normal.     Mouth/Throat:     Mouth: Mucous membranes are moist.     Dentition: No dental tenderness, gingival swelling or gum lesions.  Eyes:     Conjunctiva/sclera: Conjunctivae normal.  Pulmonary:     Effort: Pulmonary effort is normal.  Skin:    General: Skin is dry.  Neurological:     Mental Status: He is alert.  Psychiatric:        Mood and Affect: Mood normal.     ED Results / Procedures / Treatments   Labs (all labs ordered are listed,  but only abnormal results are displayed) Labs Reviewed - No data to display  EKG None  Radiology No results found.  Procedures Procedures (including critical care time)  Medications Ordered in ED Medications  cetirizine HCl (Zyrtec) 5 MG/5ML solution 5 mg (has no administration in time range)  naproxen (NAPROSYN) tablet 500 mg (has no administration in time range)    ED Course  I have reviewed the triage vital signs and the nursing notes.  Pertinent labs & imaging results that were available during my care of the patient were reviewed by me and considered in my medical decision making (see chart for details).    MDM Rules/Calculators/A&P                      Based on review of vitals, medical screening exam, lab work and/or imaging, there does not appear to be an acute, emergent etiology for the patient's symptoms. Counseled pt on good return precautions and encouraged both PCP and ED follow-up as needed.  Prior to discharge, I also discussed incidental imaging findings with patient in detail and advised appropriate, recommended follow-up in detail.  Clinical Impression: 1. Dysfunction of right eustachian tube   2. Otalgia of right ear     Disposition: Discharge  Prior to providing a prescription for a controlled substance, I independently reviewed the patient's recent prescription history on the [place] Controlled Substance Reporting System. The patient had no recent or regular prescriptions and was deemed appropriate for a brief, less than 3 day prescription of narcotic for acute analgesia.  This note was prepared with assistance of Conservation officer, historic buildings. Occasional wrong-word or sound-a-like substitutions may have occurred due to the inherent limitations of voice recognition software.  Final Clinical Impression(s) / ED Diagnoses Final diagnoses:  Dysfunction of right eustachian tube  Otalgia of right ear    Rx / DC Orders ED Discharge Orders          Ordered    cetirizine-pseudoephedrine (ZYRTEC-D) 5-120 MG tablet  Daily     12/03/19 0328    naproxen (NAPROSYN) 500 MG tablet  2 times daily     12/03/19 0328           Arlyn Dunning, PA-C 12/03/19 0328    Ward, Layla Maw, DO 12/03/19 [phone]

## 2019-12-03 NOTE — Discharge Instructions (Addendum)
Thank you for allowing me to care for you today. Please return to the emergency department if you have new or worsening symptoms. Take your medications as instructed.  ° °

## 2019-12-03 NOTE — ED Notes (Signed)
Pt called to room x1.  No answer. 

## 2020-03-15 ENCOUNTER — Encounter (HOSPITAL_COMMUNITY): Payer: Self-pay

## 2020-03-15 ENCOUNTER — Ambulatory Visit (HOSPITAL_COMMUNITY)
Admission: EM | Admit: 2020-03-15 | Discharge: 2020-03-15 | Disposition: A | Discharge status: Home / Self Care | Payer: Self-pay | Attending: Family Medicine | Admitting: Family Medicine

## 2020-03-15 ENCOUNTER — Other Ambulatory Visit: Payer: Self-pay

## 2020-03-15 DIAGNOSIS — U071 COVID-19: Secondary | ICD-10-CM

## 2020-03-15 DIAGNOSIS — R0602 Shortness of breath: Secondary | ICD-10-CM

## 2020-03-15 MED ORDER — PREDNISONE 5 MG PO TABS: ORAL_TABLET | ORAL | 0 refills | Status: DC

## 2020-03-15 MED ORDER — ALBUTEROL SULFATE HFA 108 (90 BASE) MCG/ACT IN AERS: 1.0000 | INHALATION_SPRAY | Freq: Four times a day (QID) | RESPIRATORY_TRACT | 0 refills | Status: DC | PRN

## 2020-03-15 NOTE — ED Triage Notes (Signed)
Pt is here with complaints of SOB that started today, pt states he feels he cant breath well. Pt states he tested POSITIVE at [facility](home test), pt has not taken any meds to relieve discomfort.

## 2020-03-15 NOTE — Discharge Instructions (Signed)
Please try the prednisone  Please use the inhaler after the prednisone if you still have shortness of breath  Please follow up if your symptoms fail to improve.

## 2020-03-15 NOTE — ED Provider Notes (Signed)
[facility]    CSN: [id number] Arrival date & time: 03/15/20  1859      History   Chief Complaint Chief Complaint  Patient presents with   Shortness of Breath    HPI Joshua Odonnell is a 29 y.o. male.   He is presenting with shortness of breath.  His symptoms been ongoing for a few days.  He tested positive for Covid on home test.  Having trouble with deep breathing.  No prior history of any pulmonary problems.  Has tried over-the-counter medication with limited improvement.  HPI  Past Medical History:  Diagnosis Date   Trichomonas infection     There are no problems to display for this patient.   History reviewed. No pertinent surgical history.     Home Medications    Prior to Admission medications   Medication Sig Start Date End Date Taking? Authorizing Provider  albuterol (VENTOLIN HFA) 108 (90 Base) MCG/ACT inhaler Inhale 1-2 puffs into the lungs every 6 (six) hours as needed for wheezing or shortness of breath. 03/15/20   Rosemarie Ax, MD  cetirizine-pseudoephedrine (ZYRTEC-D) 5-120 MG tablet Take 1 tablet by mouth daily. 12/03/19   Joshua Apley, PA-C  DM-Doxylamine-Acetaminophen (NYQUIL HBP COLD & FLU) 15-6.25-325 MG/15ML LIQD Take 30 mLs by mouth 2 (two) times daily as needed (cough).    [provider]  magic mouthwash w/lidocaine SOLN Take 10 mLs by mouth 3 (three) times daily as needed (for sore throat). 07/30/18   Antonietta Breach, PA-C  naproxen (NAPROSYN) 500 MG tablet Take 1 tablet (500 mg total) by mouth 2 (two) times daily. 12/03/19   Madilyn Hook A, PA-C  Phenylephrine-DM-GG-APAP (MUCINEX FAST-MAX COLD FLU) 5-10-200-325 MG/10ML LIQD Take 20 mLs by mouth every 6 (six) hours as needed (cough).    [provider]  predniSONE (DELTASONE) 5 MG tablet Take 6 pills for first day, 5 pills second day, 4 pills third day, 3 pills fourth day, 2 pills the fifth day, and 1 pill sixth day. 03/15/20   Rosemarie Ax, MD    Family  History Family History  Problem Relation Age of Onset   Healthy Mother    Healthy Father     Social History Social History   Tobacco Use   Smoking status: Former Smoker    Quit date: 07/16/2018    Years since quitting: 1.6   Smokeless tobacco: Never Used  Vaping Use   Vaping Use: Never used  Substance Use Topics   Alcohol use: No   Drug use: No     Allergies   Patient has no known allergies.   Review of Systems Review of Systems  See HPI  Physical Exam Triage Vital Signs ED Triage Vitals  Enc Vitals Group     BP 03/15/20 2020 135/87     Pulse Rate 03/15/20 2020 80     Resp 03/15/20 2020 19     Temp 03/15/20 2020 99.3 F (37.4 C)     Temp Source 03/15/20 2020 Oral     SpO2 03/15/20 2020 99 %     Weight --      Height --      Head Circumference --      Peak Flow --      Pain Score 03/15/20 2018 0     Pain Loc --      Pain Edu? --      Excl. in [place]? --    No data found.  Updated Vital Signs BP  135/87 (BP Location: Right Arm)    Pulse 80    Temp 99.3 F (37.4 C) (Oral)    Resp 19    SpO2 99%   Visual Acuity Right Eye Distance:   Left Eye Distance:   Bilateral Distance:    Right Eye Near:   Left Eye Near:    Bilateral Near:     Physical Exam Gen: NAD, alert, cooperative with exam,  ENT: normal lips, normal nasal mucosa,  Eye: normal EOM, normal conjunctiva and lids CV: S1-S2 Resp: no accessory muscle use, non-labored, end expiratory wheezing appreciated on exam. Skin: no rashes, no areas of induration  Neuro: normal tone, normal sensation to touch Psych:  normal insight, alert and oriented    UC Treatments / Results  Labs (all labs ordered are listed, but only abnormal results are displayed) Labs Reviewed - No data to display  EKG   Radiology No results found.  Procedures Procedures (including critical care time)  Medications Ordered in UC Medications - No data to display  Initial Impression / Assessment and Plan / UC  Course  I have reviewed the triage vital signs and the nursing notes.  Pertinent labs & imaging results that were available during my care of the patient were reviewed by me and considered in my medical decision making (see chart for details).     [name] is a 29 year old male that is presenting with wheezing falling at home testing kit positive for Covid.  Provided prednisone and albuterol.  Counseled on follow-up and supportive care.  Final Clinical Impressions(s) / UC Diagnoses   Final diagnoses:  COVID-19  SOB (shortness of breath)     Discharge Instructions     Please try the prednisone  Please use the inhaler after the prednisone if you still have shortness of breath  Please follow up if your symptoms fail to improve.     ED Prescriptions    Medication Sig Dispense Auth. Provider   predniSONE (DELTASONE) 5 MG tablet Take 6 pills for first day, 5 pills second day, 4 pills third day, 3 pills fourth day, 2 pills the fifth day, and 1 pill sixth day. 21 tablet Rosemarie Ax, MD   albuterol (VENTOLIN HFA) 108 (90 Base) MCG/ACT inhaler Inhale 1-2 puffs into the lungs every 6 (six) hours as needed for wheezing or shortness of breath. 18 g Rosemarie Ax, MD     PDMP not reviewed this encounter.   Rosemarie Ax, MD 03/15/20 2213

## 2020-03-16 ENCOUNTER — Telehealth (HOSPITAL_COMMUNITY): Payer: Self-pay

## 2020-03-16 MED ORDER — ALBUTEROL SULFATE HFA 108 (90 BASE) MCG/ACT IN AERS: 1.0000 | INHALATION_SPRAY | Freq: Four times a day (QID) | RESPIRATORY_TRACT | 0 refills | Status: DC | PRN

## 2020-03-16 MED ORDER — PREDNISONE 5 MG PO TABS: ORAL_TABLET | ORAL | 0 refills | Status: DC

## 2022-05-29 ENCOUNTER — Ambulatory Visit
Admission: EM | Admit: 2022-05-29 | Discharge: 2022-05-29 | Disposition: A | Discharge status: Home / Self Care | Payer: Self-pay | Attending: Internal Medicine | Admitting: Internal Medicine

## 2022-05-29 ENCOUNTER — Telehealth: Payer: Self-pay | Admitting: Emergency Medicine

## 2022-05-29 ENCOUNTER — Encounter: Payer: Self-pay | Admitting: Emergency Medicine

## 2022-05-29 DIAGNOSIS — R21 Rash and other nonspecific skin eruption: Secondary | ICD-10-CM

## 2022-05-29 MED ORDER — TRIAMCINOLONE ACETONIDE 0.1 % EX CREA: 1.0000 | TOPICAL_CREAM | Freq: Two times a day (BID) | CUTANEOUS | 0 refills | Status: DC

## 2022-05-29 NOTE — Discharge Instructions (Signed)
I have prescribed a cream to alleviate itching and rash.  Please follow-up if symptoms persist or worsen.

## 2022-05-29 NOTE — ED Triage Notes (Signed)
Pt is present today with a rash on his right hand x1 week ago

## 2022-05-29 NOTE — ED Provider Notes (Signed)
[facility]    CSN: [id number] Arrival date & time: 05/29/22  1509      History   Chief Complaint Chief Complaint  Patient presents with   Rash    HPI Joshua Odonnell is a 31 y.o. male.   Patient presents with itchy rash to third and fourth digits of right hand.  Patient denies any changes in environment including lotions, soaps, detergents, foods, etc.  Denies any associated fever.  Denies history of the same.  Patient has not used any medications to help alleviate symptoms.   Rash   Past Medical History:  Diagnosis Date   Trichomonas infection     There are no problems to display for this patient.   History reviewed. No pertinent surgical history.     Home Medications    Prior to Admission medications   Medication Sig Start Date End Date Taking? Authorizing Provider  albuterol (VENTOLIN HFA) 108 (90 Base) MCG/ACT inhaler Inhale 1-2 puffs into the lungs every 6 (six) hours as needed for wheezing or shortness of breath. 03/16/20   Myra Rude, MD  cetirizine-pseudoephedrine (ZYRTEC-D) 5-120 MG tablet Take 1 tablet by mouth daily. 12/03/19   Arlyn Dunning, PA-C  DM-Doxylamine-Acetaminophen (NYQUIL HBP COLD & FLU) 15-6.25-325 MG/15ML LIQD Take 30 mLs by mouth 2 (two) times daily as needed (cough).    [provider]  magic mouthwash w/lidocaine SOLN Take 10 mLs by mouth 3 (three) times daily as needed (for sore throat). 07/30/18   Antony Madura, PA-C  naproxen (NAPROSYN) 500 MG tablet Take 1 tablet (500 mg total) by mouth 2 (two) times daily. 12/03/19   Ronnie Doss A, PA-C  Phenylephrine-DM-GG-APAP (MUCINEX FAST-MAX COLD FLU) 5-10-200-325 MG/10ML LIQD Take 20 mLs by mouth every 6 (six) hours as needed (cough).    [provider]  predniSONE (DELTASONE) 5 MG tablet Take 6 pills for first day, 5 pills second day, 4 pills third day, 3 pills fourth day, 2 pills the fifth day, and 1 pill sixth day. 03/16/20   Myra Rude, MD  triamcinolone  cream (KENALOG) 0.1 % Apply 1 Application topically 2 (two) times daily. 05/29/22   Gustavus Bryant, FNP    Family History Family History  Problem Relation Age of Onset   Healthy Mother    Healthy Father     Social History Social History   Tobacco Use   Smoking status: Former    Types: Cigarettes    Quit date: 07/16/2018    Years since quitting: 3.8   Smokeless tobacco: Never  Vaping Use   Vaping Use: Never used  Substance Use Topics   Alcohol use: No   Drug use: No     Allergies   Patient has no known allergies.   Review of Systems Review of Systems Per HPI  Physical Exam Triage Vital Signs ED Triage Vitals  Enc Vitals Group     BP 05/29/22 1620 134/78     Pulse Rate 05/29/22 1619 75     Resp 05/29/22 1619 18     Temp 05/29/22 1619 97.8 F (36.6 C)     Temp src --      SpO2 05/29/22 1620 95 %     Weight --      Height --      Head Circumference --      Peak Flow --      Pain Score 05/29/22 1619 0     Pain Loc --  Pain Edu? --      Excl. in [place]? --    No data found.  Updated Vital Signs BP 134/78   Pulse 75   Temp 97.8 F (36.6 C)   Resp 18   SpO2 95%   Visual Acuity Right Eye Distance:   Left Eye Distance:   Bilateral Distance:    Right Eye Near:   Left Eye Near:    Bilateral Near:     Physical Exam Constitutional:      General: He is not in acute distress.    Appearance: Normal appearance. He is not toxic-appearing or diaphoretic.  HENT:     Head: Normocephalic and atraumatic.  Eyes:     Extraocular Movements: Extraocular movements intact.     Conjunctiva/sclera: Conjunctivae normal.  Pulmonary:     Effort: Pulmonary effort is normal.  Skin:    Comments: Patient has slightly raised pinpoint, flesh-colored lesions that are mildly vesicular but not pustular noted on the medial and lateral sides of right third digit and medial side of right fourth digit.  Neurological:     General: No focal deficit present.     Mental Status: He  is alert and oriented to person, place, and time. Mental status is at baseline.  Psychiatric:        Mood and Affect: Mood normal.        Behavior: Behavior normal.        Thought Content: Thought content normal.        Judgment: Judgment normal.      UC Treatments / Results  Labs (all labs ordered are listed, but only abnormal results are displayed) Labs Reviewed - No data to display  EKG   Radiology No results found.  Procedures Procedures (including critical care time)  Medications Ordered in UC Medications - No data to display  Initial Impression / Assessment and Plan / UC Course  I have reviewed the triage vital signs and the nursing notes.  Pertinent labs & imaging results that were available during my care of the patient were reviewed by me and considered in my medical decision making (see chart for details).     Differential diagnoses include contact dermatitis versus dyshidrotic eczema.  Will treat with triamcinolone cream.  Advised patient to follow-up if symptoms persist or worsen.  Patient verbalized understanding and was agreeable with plan. Final Clinical Impressions(s) / UC Diagnoses   Final diagnoses:  Rash and nonspecific skin eruption     Discharge Instructions      I have prescribed a cream to alleviate itching and rash.  Please follow-up if symptoms persist or worsen.    ED Prescriptions     Medication Sig Dispense Auth. Provider   triamcinolone cream (KENALOG) 0.1 % Apply 1 Application topically 2 (two) times daily. 30 g Gustavus Bryant, [place]      PDMP not reviewed this encounter.   Gustavus Bryant, [place] 05/29/22 [phone]

## 2022-11-16 ENCOUNTER — Ambulatory Visit
Admission: EM | Admit: 2022-11-16 | Discharge: 2022-11-16 | Discharge status: Against Medical Advice | Payer: Self-pay | Attending: Family Medicine | Admitting: Family Medicine

## 2022-11-16 NOTE — ED Notes (Signed)
Pt not in waiting area x 2 

## 2022-11-16 NOTE — ED Notes (Signed)
Pt not in waiting area 

## 2023-03-12 ENCOUNTER — Ambulatory Visit: Payer: Self-pay | Admitting: Physician Assistant

## 2023-03-12 ENCOUNTER — Encounter: Payer: Self-pay | Admitting: Physician Assistant

## 2023-03-12 VITALS — BP 155/90 | HR 63 | Ht 72.0 in | Wt 218.0 lb

## 2023-03-12 DIAGNOSIS — Z13220 Encounter for screening for lipoid disorders: Secondary | ICD-10-CM

## 2023-03-12 DIAGNOSIS — L309 Dermatitis, unspecified: Secondary | ICD-10-CM

## 2023-03-12 DIAGNOSIS — R030 Elevated blood-pressure reading, without diagnosis of hypertension: Secondary | ICD-10-CM

## 2023-03-12 MED ORDER — NYSTATIN-TRIAMCINOLONE 100000-0.1 UNIT/GM-% EX OINT: 1.0000 | TOPICAL_OINTMENT | Freq: Two times a day (BID) | CUTANEOUS | 0 refills | Status: AC

## 2023-03-12 NOTE — Patient Instructions (Addendum)
Your blood pressure is elevated today.  I encourage you to check your blood pressure at home, keep a written log and have available for all office visits.  I sent a prescription cream to your pharmacy for the bumps on your wrist and ankles, I encourage you to use an all-natural lip balm for your concern on your lips.  We will call you with today's lab results.  Roney Jaffe, PA-C Physician Assistant [facility] [email or web address]   How to Take Your Blood Pressure Blood pressure is a measurement of how strongly your blood is pressing against the walls of your arteries. Arteries are blood vessels that carry blood from your heart throughout your body. Your health care provider takes your blood pressure at each office visit. You can also take your own blood pressure at home with a blood pressure monitor. You may need to take your own blood pressure to: Confirm a diagnosis of high blood pressure (hypertension). Monitor your blood pressure over time. Make sure your blood pressure medicine is working. Supplies needed: Blood pressure monitor. A chair to sit in. This should be a chair where you can sit upright with your back supported. Do not sit on a soft couch or an armchair. Table or desk. Small notebook and pencil or pen. How to prepare To get the most accurate reading, avoid the following for 30 minutes before you check your blood pressure: Drinking caffeine. Drinking alcohol. Eating. Smoking. Exercising. Five minutes before you check your blood pressure: Use the bathroom and urinate so that you have an empty bladder. Sit quietly in a chair. Do not talk. How to take your blood pressure To check your blood pressure, follow the instructions in the manual that came with your blood pressure monitor. If you have a digital blood pressure monitor, the instructions may be as follows: Sit up straight in a chair. Place your feet on the  floor. Do not cross your ankles or legs. Rest your left arm at the level of your heart on a table or desk or on the arm of a chair. Pull up your shirt sleeve. Wrap the blood pressure cuff around the upper part of your left arm, 1 inch (2.5 cm) above your elbow. It is best to wrap the cuff around bare skin. Fit the cuff snugly, but not too tightly, around your arm. You should be able to place only one finger between the cuff and your arm. Position the cord so that it rests in the bend of your elbow. Press the power button. Sit quietly while the cuff inflates and deflates. Read the digital reading on the monitor screen and write the numbers down (record them) in a notebook. Wait 2-3 minutes, then repeat the steps, starting at step 1. What does my blood pressure reading mean? A blood pressure reading consists of a higher number over a lower number. Ideally, your blood pressure should be below 120/80. The first ("top") number is called the systolic pressure. It is a measure of the pressure in your arteries as your heart beats. The second ("bottom") number is called the diastolic pressure. It is a measure of the pressure in your arteries as the heart relaxes. Blood pressure is classified into four stages. The following are the stages for adults who do not have a short-term serious illness or a chronic condition. Systolic pressure and diastolic pressure are measured in a unit called mm Hg (millimeters of mercury).  Normal Systolic pressure: below 120. Diastolic pressure: below 80. Elevated  Systolic pressure: 120-129. Diastolic pressure: below 80. Hypertension stage 1 Systolic pressure: 130-139. Diastolic pressure: 80-89. Hypertension stage 2 Systolic pressure: 140 or above. Diastolic pressure: 90 or above. You can have elevated blood pressure or hypertension even if only the systolic or only the diastolic number in your reading is higher than normal. Follow these instructions at  home: Medicines Take over-the-counter and prescription medicines only as told by your health care provider. Tell your health care provider if you are having any side effects from blood pressure medicine. General instructions Check your blood pressure as often as recommended by your health care provider. Check your blood pressure at the same time every day. Take your monitor to the next appointment with your health care provider to make sure that: You are using it correctly. It provides accurate readings. Understand what your goal blood pressure numbers are. Keep all follow-up visits. This is important. General tips Your health care provider can suggest a reliable monitor that will meet your needs. There are several types of home blood pressure monitors. Choose a monitor that has an arm cuff. Do not choose a monitor that measures your blood pressure from your wrist or finger. Choose a cuff that wraps snugly, not too tight or too loose, around your upper arm. You should be able to fit only one finger between your arm and the cuff. You can buy a blood pressure monitor at most drugstores or online. Where to find more information American Heart Association: www.heart.org Contact a health care provider if: Your blood pressure is consistently high. Your blood pressure is suddenly low. Get help right away if: Your systolic blood pressure is higher than 180. Your diastolic blood pressure is higher than 120. These symptoms may be an emergency. Get help right away. Call 911. Do not wait to see if the symptoms will go away. Do not drive yourself to the hospital. Summary Blood pressure is a measurement of how strongly your blood is pressing against the walls of your arteries. A blood pressure reading consists of a higher number over a lower number. Ideally, your blood pressure should be below 120/80. Check your blood pressure at the same time every day. Avoid caffeine, alcohol, smoking, and  exercise for 30 minutes prior to checking your blood pressure. These agents can affect the accuracy of the blood pressure reading. This information is not intended to replace advice given to you by your health care provider. Make sure you discuss any questions you have with your health care provider. Document Revised: 03/16/2021 Document Reviewed: 03/16/2021 Elsevier Patient Education  2024 [employer] Inc.  Seborrheic Keratosis A seborrheic keratosis is a common, noncancerous (benign) skin growth. These growths are velvety, waxy, or rough spots that appear on the skin. They are often tan, brown, or black. The skin growths can be flat or raised and may be scaly. What are the causes? The cause of this condition is not known. What increases the risk? You are more likely to develop this condition if you: Have a family history of seborrheic keratosis. Are 65 years old or older. Are pregnant. Have had estrogen replacement therapy. What are the signs or symptoms? Symptoms of this condition include growths on the face, chest, shoulders, back, or other areas. These growths: Are usually painless, but may become irritated and itchy. Can be tan, yellow, brown, black, or other colors. Are slightly raised or have a flat surface. Are sometimes rough or wart-like in texture. Are often velvety or waxy on the surface. Are round  or oval-shaped. Often occur in groups, but may occur as a single growth. How is this diagnosed? This condition is diagnosed with a medical history and physical exam. A sample of the growth may be tested (skin biopsy). You may also need to see a skin specialist (dermatologist). How is this treated? Treatment is not usually needed for this condition unless the growths are irritated or bleed often. You may also choose to have the growths removed if you do not like their appearance. Growth removal may include a procedure in which: Liquid nitrogen is applied to "freeze" off the growth  (cryosurgery). This is the most common procedure. The growth is burned off with electricity (electrocautery). The growth is removed by scraping (curettage). Follow these instructions at home: Watch your growth or growths for any changes. Do not scratch or pick at the growth or growths. This can cause them to become irritated or infected. Contact a health care provider if: You suddenly have many new growths. Your growth bleeds, itches, or hurts. Your growth suddenly becomes larger or changes color. Summary A seborrheic keratosis is a common, noncancerous skin growth. Treatment is not usually needed for this condition unless the growths are irritated or bleed often. Watch your growth or growths for any changes. Contact a health care provider if you suddenly have many new growths or your growth suddenly becomes larger or changes color. This information is not intended to replace advice given to you by your health care provider. Make sure you discuss any questions you have with your health care provider. Document Revised: 09/15/2021 Document Reviewed: 09/15/2021 Elsevier Patient Education  2024 [employer].

## 2023-03-12 NOTE — Progress Notes (Signed)
New Patient Office Visit  Subjective    Patient ID: Joshua Odonnell, male    DOB: Dec 15, 1990  Age: 32 y.o. MRN: [id number]  CC:  Chief Complaint  Patient presents with   Rash    Noticed around lips and on wrist, denies any pain     HPI Torrell Pietrangelo states that he has small bumps on both his wrists and his ankles.  States that they have been present for the past few months.  Denies pruritus.  States that he believes that ever since he started using hand sanitizer a couple years ago he has been experiencing more rashes on his hands.  Otherwise no new medications, detergents, body lotions, fragrances, detergents.  No one else in house with similar rash.  States that he was seen at urgent care in May 2020 for for similar complaint and was given triamcinolone.  States that it did help resolve the rash on his fingers but not on his wrist.  States that he also noticed some bumps on his lips, states that he also feels these have been present for the past couple months, has not tried anything other than Chapstick without relief.  Denies burning or itching.  States that he does not check his blood pressure at home, denies hypertensive symptoms.  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  States that he drinks an adequate amount of water on a daily basis, recently started going back to gym for daily exercise.  Also endorses a 60 pound weight gain after his mother passed away 6 months ago but has lost 40 of those pounds.  Is currently working on weight loss.    Outpatient Encounter Medications as of 03/12/2023  Medication Sig   albuterol (VENTOLIN HFA) 108 (90 Base) MCG/ACT inhaler Inhale 1-2 puffs into the lungs every 6 (six) hours as needed for wheezing or shortness of breath.   cetirizine-pseudoephedrine (ZYRTEC-D) 5-120 MG tablet Take 1 tablet by mouth daily.   DM-Doxylamine-Acetaminophen (NYQUIL HBP COLD & FLU) 15-6.25-325 MG/15ML LIQD Take 30 mLs by mouth 2 (two) times  daily as needed (cough).   magic mouthwash w/lidocaine SOLN Take 10 mLs by mouth 3 (three) times daily as needed (for sore throat).   naproxen (NAPROSYN) 500 MG tablet Take 1 tablet (500 mg total) by mouth 2 (two) times daily.   nystatin-triamcinolone ointment (MYCOLOG) Apply 1 Application topically 2 (two) times daily.   Phenylephrine-DM-GG-APAP (MUCINEX FAST-MAX COLD FLU) 5-10-200-325 MG/10ML LIQD Take 20 mLs by mouth every 6 (six) hours as needed (cough).   predniSONE (DELTASONE) 5 MG tablet Take 6 pills for first day, 5 pills second day, 4 pills third day, 3 pills fourth day, 2 pills the fifth day, and 1 pill sixth day.   triamcinolone cream (KENALOG) 0.1 % Apply 1 Application topically 2 (two) times daily.   No facility-administered encounter medications on file as of 03/12/2023.    Past Medical History:  Diagnosis Date   Trichomonas infection     History reviewed. No pertinent surgical history.  Family History  Problem Relation Age of Onset   Healthy Mother    Healthy Father     Social History   Socioeconomic History   Marital status: Single    Spouse name: Not on file   Number of children: Not on file   Years of education: Not on file   Highest education level: Not on file  Occupational History   Not on file  Tobacco Use   Smoking  status: Former    Current packs/day: 0.00    Types: Cigarettes    Quit date: 07/16/2018    Years since quitting: 4.6   Smokeless tobacco: Never  Vaping Use   Vaping status: Never Used  Substance and Sexual Activity   Alcohol use: No   Drug use: No   Sexual activity: Yes    Birth control/protection: None  Other Topics Concern   Not on file  Social History Narrative   Not on file   Social Determinants of Health   Financial Resource Strain: Not on file  Food Insecurity: Not on file  Transportation Needs: Not on file  Physical Activity: Not on file  Stress: Not on file  Social Connections: Not on file  Intimate Partner Violence:  Not on file    Review of Systems  Constitutional: Negative.   HENT: Negative.    Eyes: Negative.   Respiratory:  Negative for shortness of breath.   Cardiovascular:  Negative for chest pain.  Gastrointestinal: Negative.   Genitourinary: Negative.   Musculoskeletal: Negative.   Skin:  Positive for rash. Negative for itching.  Neurological: Negative.   Endo/Heme/Allergies: Negative.   Psychiatric/Behavioral: Negative.          Objective    BP (!) 155/90 (BP Location: Left Arm)   Pulse 63   Ht 6' (1.829 m)   Wt 218 lb (98.9 kg)   SpO2 95%   BMI 29.57 kg/m   Physical Exam Vitals and nursing note reviewed.  Constitutional:      Appearance: Normal appearance.  HENT:     Head: Normocephalic and atraumatic.     Right Ear: External ear normal.     Left Ear: External ear normal.     Nose: Nose normal.     Mouth/Throat:     Lips: Pink. No lesions.     Mouth: Mucous membranes are moist.     Pharynx: Oropharynx is clear.     Comments: Unable to visualize any bumps/ lesions  Eyes:     Extraocular Movements: Extraocular movements intact.     Conjunctiva/sclera: Conjunctivae normal.     Pupils: Pupils are equal, round, and reactive to light.  Cardiovascular:     Rate and Rhythm: Normal rate and regular rhythm.     Pulses: Normal pulses.     Heart sounds: Normal heart sounds.  Pulmonary:     Effort: Pulmonary effort is normal.  Musculoskeletal:     Cervical back: Normal range of motion and neck supple.  Skin:    Findings: No erythema. Rash is not pustular.     Comments: Several raised waxy brown rough spots on both wrists, and both ankles.  Neurological:     General: No focal deficit present.     Mental Status: He is alert and oriented to person, place, and time.  Psychiatric:        Mood and Affect: Mood normal.        Behavior: Behavior normal.        Thought Content: Thought content normal.        Judgment: Judgment normal.         Assessment & Plan:    Problem List Items Addressed This Visit   None Visit Diagnoses     Screening, lipid    -  Primary   Relevant Orders   Lipid panel   Dermatitis       Relevant Medications   nystatin-triamcinolone ointment (MYCOLOG)   Elevated blood pressure reading in  office without diagnosis of hypertension       Relevant Orders   CBC with Differential/Platelet   Comp. Metabolic Panel (12)   TSH      1. Dermatitis Dermatitis vs seborrheic keratosis, trial Mycolog.  Consider dermatology referral if this does not offer resolution.  Patient education given on supportive care - nystatin-triamcinolone ointment (MYCOLOG); Apply 1 Application topically 2 (two) times daily.  Dispense: 30 g; Refill: 0  2. Elevated blood pressure reading in office without diagnosis of hypertension Patient encouraged to check blood pressure at home, keep a written log and have available for all office visits.  Patient given appointment to establish care at Primary [facility] at [facility] in 13 days.  Patient encouraged to take blood pressure log to appointment.  Patient encouraged to return to mobile unit as needed.  Red flags given for prompt reevaluation. - CBC with Differential/Platelet - Comp. Metabolic Panel (12) - TSH  3. Screening, lipid Fasting labs completed today - Lipid panel   I have reviewed the patient's medical history (PMH, PSH, Social History, Family History, Medications, and allergies) , and have been updated if relevant. I spent 40 minutes reviewing chart and  face to face time with patient.   Return in about 13 days (around 03/25/2023) for with Ricky Stabs, NP at Primary Care at [facility].   Kasandra Knudsen Mayers, PA-C

## 2023-03-13 ENCOUNTER — Other Ambulatory Visit: Payer: Self-pay

## 2023-03-25 ENCOUNTER — Encounter: Payer: Self-pay | Admitting: Family

## 2023-03-25 NOTE — Progress Notes (Signed)
Erroneous encounter-disregard

## 2023-04-11 ENCOUNTER — Ambulatory Visit (INDEPENDENT_AMBULATORY_CARE_PROVIDER_SITE_OTHER): Payer: Self-pay | Admitting: Family

## 2023-04-11 ENCOUNTER — Encounter: Payer: Self-pay | Admitting: Family

## 2023-04-11 VITALS — BP 130/88 | HR 83 | Temp 98.2°F | Ht 71.0 in | Wt 219.6 lb

## 2023-04-11 DIAGNOSIS — Z1159 Encounter for screening for other viral diseases: Secondary | ICD-10-CM

## 2023-04-11 DIAGNOSIS — Z1329 Encounter for screening for other suspected endocrine disorder: Secondary | ICD-10-CM

## 2023-04-11 DIAGNOSIS — Z130 Encounter for screening for diseases of the blood and blood-forming organs and certain disorders involving the immune mechanism: Secondary | ICD-10-CM

## 2023-04-11 DIAGNOSIS — Z13228 Encounter for screening for other metabolic disorders: Secondary | ICD-10-CM

## 2023-04-11 DIAGNOSIS — Z0130 Encounter for examination of blood pressure without abnormal findings: Secondary | ICD-10-CM

## 2023-04-11 DIAGNOSIS — Z13220 Encounter for screening for lipoid disorders: Secondary | ICD-10-CM

## 2023-04-11 DIAGNOSIS — Z131 Encounter for screening for diabetes mellitus: Secondary | ICD-10-CM

## 2023-04-11 DIAGNOSIS — Z7689 Persons encountering health services in other specified circumstances: Secondary | ICD-10-CM

## 2023-04-11 DIAGNOSIS — R21 Rash and other nonspecific skin eruption: Secondary | ICD-10-CM

## 2023-04-11 DIAGNOSIS — Z114 Encounter for screening for human immunodeficiency virus [HIV]: Secondary | ICD-10-CM

## 2023-04-11 DIAGNOSIS — Z0000 Encounter for general adult medical examination without abnormal findings: Secondary | ICD-10-CM

## 2023-04-11 MED ORDER — TRIAMCINOLONE ACETONIDE 0.1 % EX CREA: 1.0000 | TOPICAL_CREAM | Freq: Two times a day (BID) | CUTANEOUS | 0 refills | Status: AC

## 2023-04-11 NOTE — Progress Notes (Signed)
Patient stated they never took his blood at the mobile [facility].   Patient is asking for full panel blood work to check everything.

## 2023-04-11 NOTE — Progress Notes (Signed)
Subjective:    Joshua Odonnell - 32 y.o. male MRN [id number]  Date of birth: [birth year]  HPI  Joshua Odonnell is to establish care and annual physical exam.  Current issues and/or concerns: - Doing well on Triamcinolone cream and rash has improved.  - No issues/concerns for discussion today.   ROS per HPI    Health Maintenance:  Health Maintenance Due  Topic Date Due   HIV Screening  Never done   Hepatitis C Screening  Never done   DTaP/Tdap/Td (1 - Tdap) Never done   INFLUENZA VACCINE  Never done   COVID-19 Vaccine (1 - 2023-24 season) Never done    Past Medical History: There are no problems to display for this patient.   Social History   reports that he quit smoking about 4 years ago. His smoking use included cigarettes. He has never used smokeless tobacco. He reports that he does not drink alcohol and does not use drugs.   Family History  family history includes Healthy in his father and mother.   Medications: reviewed and updated   Objective:   Physical Exam BP 130/88   Pulse 83   Temp 98.2 F (36.8 C) (Oral)   Ht 5\' 11"  (1.803 m)   Wt 219 lb 9.6 oz (99.6 kg)   SpO2 98%   BMI 30.63 kg/m   Physical Exam HENT:     Head: Normocephalic and atraumatic.     Right Ear: Tympanic membrane, ear canal and external ear normal.     Left Ear: Tympanic membrane, ear canal and external ear normal.     Nose: Nose normal.     Mouth/Throat:     Mouth: Mucous membranes are moist.     Pharynx: Oropharynx is clear.  Eyes:     Extraocular Movements: Extraocular movements intact.     Conjunctiva/sclera: Conjunctivae normal.     Pupils: Pupils are equal, round, and reactive to light.  Neck:     Thyroid: No thyroid mass, thyromegaly or thyroid tenderness.  Cardiovascular:     Rate and Rhythm: Normal rate and regular rhythm.     Pulses: Normal pulses.     Heart sounds: Normal heart sounds.  Pulmonary:     Effort: Pulmonary effort is normal.     Breath sounds: Normal breath  sounds.  Abdominal:     General: Bowel sounds are normal.     Palpations: Abdomen is soft.  Genitourinary:    Comments: Patient declined.  Musculoskeletal:        General: Normal range of motion.     Right shoulder: Normal.     Left shoulder: Normal.     Right upper arm: Normal.     Left upper arm: Normal.     Right elbow: Normal.     Left elbow: Normal.     Right forearm: Normal.     Left forearm: Normal.     Right wrist: Normal.     Left wrist: Normal.     Right hand: Normal.     Left hand: Normal.     Cervical back: Normal, normal range of motion and neck supple.     Thoracic back: Normal.     Lumbar back: Normal.     Right hip: Normal.     Left hip: Normal.     Right upper leg: Normal.     Left upper leg: Normal.     Right knee: Normal.     Left knee: Normal.     Right  lower leg: Normal.     Left lower leg: Normal.     Right ankle: Normal.     Left ankle: Normal.     Right foot: Normal.     Left foot: Normal.  Skin:    General: Skin is warm and dry.     Capillary Refill: Capillary refill takes less than 2 seconds.  Neurological:     General: No focal deficit present.     Mental Status: He is alert and oriented to person, place, and time.  Psychiatric:        Mood and Affect: Mood normal.        Behavior: Behavior normal.       Assessment & Plan:  1. Encounter to establish care 2. Annual physical exam - Counseled on 150 minutes of exercise per week as tolerated, healthy eating (including decreased daily intake of saturated fats, cholesterol, added sugars, sodium), STI prevention, and routine healthcare maintenance.  3. Screening for metabolic disorder - Routine screening.  - CMP14+EGFR  4. Screening for deficiency anemia - Routine screening.  - CBC  5. Diabetes mellitus screening - Routine screening.  - Hemoglobin A1c  6. Screening cholesterol level - Routine screening.  - Lipid panel  7. Thyroid disorder screen - Routine screening.  -  TSH  8. Encounter for screening for HIV - Routine screening.  - HIV antibody (with reflex)  9. Need for hepatitis C screening test - Routine screening.  - Hepatitis C Antibody  10. Blood pressure check - Blood pressure normal today in office.  - Follow-up with primary provider as scheduled.   11. Rash and nonspecific skin eruption - Continue Triamcinolone cream as prescribed. Counseled on medication adherence. - Follow-up with primary provider as scheduled.  - triamcinolone cream (KENALOG) 0.1 %; Apply 1 Application topically 2 (two) times daily.  Dispense: 30 g; Refill: 0   Patient was given clear instructions to go to Emergency Department or return to medical center if symptoms don't improve, worsen, or new problems develop.The patient verbalized understanding.  I discussed the assessment and treatment plan with the patient. The patient was provided an opportunity to ask questions and all were answered. The patient agreed with the plan and demonstrated an understanding of the instructions.   The patient was advised to call back or seek an in-person evaluation if the symptoms worsen or if the condition fails to improve as anticipated.    Ricky Stabs, NP 04/11/2023, 11:12 AM Primary Care at [facility]

## 2023-04-12 ENCOUNTER — Other Ambulatory Visit: Payer: Self-pay | Admitting: Family

## 2023-04-12 DIAGNOSIS — E785 Hyperlipidemia, unspecified: Secondary | ICD-10-CM | POA: Insufficient documentation

## 2023-04-12 LAB — LIPID PANEL
Chol/HDL Ratio: 5.4 {ratio} — ABNORMAL HIGH (ref 0.0–5.0)
Cholesterol, Total: 237 mg/dL — ABNORMAL HIGH (ref 100–199)
HDL: 44 mg/dL (ref >39)
LDL Chol Calc (NIH): 171 mg/dL — ABNORMAL HIGH (ref 0–99)
Triglycerides: 121 mg/dL (ref 0–149)
VLDL Cholesterol Cal: 22 mg/dL (ref 5–40)

## 2023-04-12 LAB — CMP14+EGFR
ALT: 14 [IU]/L (ref 0–44)
AST: 17 [IU]/L (ref 0–40)
Albumin: 4.4 g/dL (ref 4.1–5.1)
Alkaline Phosphatase: 63 [IU]/L (ref 44–121)
BUN/Creatinine Ratio: 12 (ref 9–20)
BUN: 11 mg/dL (ref 6–20)
Bilirubin Total: 0.3 mg/dL (ref 0.0–1.2)
CO2: 22 mmol/L (ref 20–29)
Calcium: 9.3 mg/dL (ref 8.7–10.2)
Chloride: 105 mmol/L (ref 96–106)
Creatinine, Ser: 0.93 mg/dL (ref 0.76–1.27)
Globulin, Total: 2.7 g/dL (ref 1.5–4.5)
Glucose: 93 mg/dL (ref 70–99)
Potassium: 4.7 mmol/L (ref 3.5–5.2)
Sodium: 140 mmol/L (ref 134–144)
Total Protein: 7.1 g/dL (ref 6.0–8.5)
eGFR: 111 mL/min/{1.73_m2} (ref >59)

## 2023-04-12 LAB — CBC
Hematocrit: 50 % (ref 37.5–51.0)
Hemoglobin: 15.7 g/dL (ref 13.0–17.7)
MCH: 26.6 pg (ref 26.6–33.0)
MCHC: 31.4 g/dL — ABNORMAL LOW (ref 31.5–35.7)
MCV: 85 fL (ref 79–97)
Platelets: 193 10*3/uL (ref 150–450)
RBC: 5.91 x10E6/uL — ABNORMAL HIGH (ref 4.14–5.80)
RDW: 14.7 % (ref 11.6–15.4)
WBC: 6.2 10*3/uL (ref 3.4–10.8)

## 2023-04-12 LAB — TSH: TSH: 1.1 u[IU]/mL (ref 0.450–4.500)

## 2023-04-12 LAB — HEPATITIS C ANTIBODY: Hep C Virus Ab: NONREACTIVE

## 2023-04-12 LAB — HEMOGLOBIN A1C
Est. average glucose Bld gHb Est-mCnc: 120 mg/dL
Hgb A1c MFr Bld: 5.8 % — ABNORMAL HIGH (ref 4.8–5.6)

## 2023-04-12 LAB — HIV ANTIBODY (ROUTINE TESTING W REFLEX): HIV Screen 4th Generation wRfx: NONREACTIVE

## 2023-04-12 MED ORDER — ATORVASTATIN CALCIUM 20 MG PO TABS
20.0000 mg | ORAL_TABLET | Freq: Every day | ORAL | 0 refills | Status: AC
Start: 2023-04-12 — End: ?

## 2024-08-06 ENCOUNTER — Other Ambulatory Visit: Payer: Self-pay | Admitting: Family

## 2024-08-06 DIAGNOSIS — R21 Rash and other nonspecific skin eruption: Secondary | ICD-10-CM

## 2024-08-06 NOTE — Telephone Encounter (Signed)
 Last OV-03/2023  please advise

## 2024-08-07 ENCOUNTER — Other Ambulatory Visit: Payer: Self-pay | Admitting: Family

## 2024-08-07 DIAGNOSIS — R21 Rash and other nonspecific skin eruption: Secondary | ICD-10-CM

## 2024-08-07 NOTE — Telephone Encounter (Signed)
 Requested medication (s) are due for refill today - yes  Requested medication (s) are on the active medication list -yes  Future visit scheduled -no  Last refill: 04/11/23 30g  Notes to clinic: Duplicate request- non delegated Rx  Requested Prescriptions  Pending Prescriptions Disp Refills   triamcinolone  cream (KENALOG ) 0.1 % [Pharmacy Med Name: TRIAMCINOLONE  0.1% CREAM] 30 g 0    Sig: APPLY TO AFFECTED AREA TWICE A DAY     Not Delegated - Dermatology:  Corticosteroids Failed - 08/07/2024  3:39 PM      Failed - This refill cannot be delegated      Failed - Valid encounter within last 12 months    Recent Outpatient Visits           1 year ago Encounter to establish care   [facility], [place], NP                 Requested Prescriptions  Pending Prescriptions Disp Refills   triamcinolone  cream (KENALOG ) 0.1 % [Pharmacy Med Name: TRIAMCINOLONE  0.1% CREAM] 30 g 0    Sig: APPLY TO AFFECTED AREA TWICE A DAY     Not Delegated - Dermatology:  Corticosteroids Failed - 08/07/2024  3:39 PM      Failed - This refill cannot be delegated      Failed - Valid encounter within last 12 months    Recent Outpatient Visits           1 year ago Encounter to establish care   [facility], [place] PARAS, NP

## 2024-08-07 NOTE — Telephone Encounter (Signed)
 Schedule appointment. Last office visit 04/11/2023. During the interim report to the Emergency Department/Urgent Care/call 911 for immediate medical evaluation.

## 2024-08-07 NOTE — Telephone Encounter (Signed)
 Noted.
